# Patient Record
Sex: Male | Born: 1980 | Race: Black or African American | Hispanic: No | Marital: Single | Smoking: Never smoker
Health system: Southern US, Community
[De-identification: ages and names within clinical notes are randomized; demographics above are authoritative.]

## PROBLEM LIST (undated history)

## (undated) DIAGNOSIS — M549 Dorsalgia, unspecified: Secondary | ICD-10-CM

---

## 2000-04-06 ENCOUNTER — Emergency Department (HOSPITAL_COMMUNITY): Admission: EM | Admit: 2000-04-06 | Discharge: 2000-04-06 | Payer: Self-pay | Admitting: Emergency Medicine

## 2000-04-09 ENCOUNTER — Emergency Department (HOSPITAL_COMMUNITY): Admission: EM | Admit: 2000-04-09 | Discharge: 2000-04-09 | Payer: Self-pay | Admitting: *Deleted

## 2008-02-07 ENCOUNTER — Emergency Department (HOSPITAL_COMMUNITY): Admission: EM | Admit: 2008-02-07 | Discharge: 2008-02-07 | Payer: Self-pay | Admitting: Emergency Medicine

## 2008-07-30 ENCOUNTER — Emergency Department (HOSPITAL_COMMUNITY): Admission: EM | Admit: 2008-07-30 | Discharge: 2008-07-30 | Payer: Self-pay | Admitting: Emergency Medicine

## 2009-06-10 ENCOUNTER — Emergency Department (HOSPITAL_COMMUNITY): Admission: EM | Admit: 2009-06-10 | Discharge: 2009-06-10 | Payer: Self-pay | Admitting: Emergency Medicine
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
## 2010-11-11 LAB — CBC
Hemoglobin: 15 g/dL (ref 13.0–17.0)
MCHC: 34.3 g/dL (ref 30.0–36.0)
MCV: 87 fL (ref 78.0–100.0)
RBC: 5.03 MIL/uL (ref 4.22–5.81)

## 2010-11-11 LAB — COMPREHENSIVE METABOLIC PANEL
ALT: 14 U/L (ref 0–53)
Alkaline Phosphatase: 51 U/L (ref 39–117)
CO2: 25 mEq/L (ref 19–32)
Calcium: 9 mg/dL (ref 8.4–10.5)
Chloride: 104 mEq/L (ref 96–112)
GFR calc non Af Amer: >60 mL/min (ref >60)
Glucose, Bld: 95 mg/dL (ref 70–99)
Sodium: 138 mEq/L (ref 135–145)
Total Bilirubin: 0.6 mg/dL (ref 0.3–1.2)

## 2010-11-11 LAB — PROTIME-INR: Prothrombin Time: 13.1 seconds (ref 11.6–15.2)

## 2010-11-11 LAB — POCT I-STAT, CHEM 8
BUN: 12 mg/dL (ref 6–23)
Calcium, Ion: 1.14 mmol/L (ref 1.12–1.32)
Creatinine, Ser: 1 mg/dL (ref 0.4–1.5)
Glucose, Bld: 91 mg/dL (ref 70–99)
TCO2: 25 mmol/L (ref 0–100)

## 2010-11-11 LAB — LACTIC ACID, PLASMA: Lactic Acid, Venous: 1.4 mmol/L (ref 0.5–2.2)

## 2011-10-04 ENCOUNTER — Encounter (HOSPITAL_COMMUNITY): Payer: Self-pay | Admitting: Emergency Medicine

## 2011-10-04 ENCOUNTER — Emergency Department (HOSPITAL_COMMUNITY)
Admission: EM | Admit: 2011-10-04 | Discharge: 2011-10-05 | Disposition: A | Discharge status: Home / Self Care | Payer: Self-pay | Attending: Emergency Medicine | Admitting: Emergency Medicine

## 2011-10-04 DIAGNOSIS — IMO0001 Reserved for inherently not codable concepts without codable children: Secondary | ICD-10-CM | POA: Insufficient documentation

## 2011-10-04 DIAGNOSIS — L03317 Cellulitis of buttock: Secondary | ICD-10-CM | POA: Insufficient documentation

## 2011-10-04 DIAGNOSIS — L0231 Cutaneous abscess of buttock: Secondary | ICD-10-CM | POA: Insufficient documentation

## 2011-10-04 DIAGNOSIS — L0291 Cutaneous abscess, unspecified: Secondary | ICD-10-CM
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
## 2011-10-05 MED ORDER — SULFAMETHOXAZOLE-TRIMETHOPRIM 800-160 MG PO TABS: 1.0000 | ORAL_TABLET | Freq: Two times a day (BID) | ORAL | Status: AC

## 2011-10-05 NOTE — ED Notes (Signed)
Patient c/o small lesion to left buttocks. Lesion is small, reddened, localized to upper part of buttock. Patient states he thinks "it might be MRSA"

## 2011-10-05 NOTE — ED Provider Notes (Signed)
History     CSN: [id number]  Arrival date & time 10/04/11  2133   First MD Initiated Contact with Patient 10/05/11 0149      Chief Complaint  Patient presents with  . Wound Check    (Consider location/radiation/quality/duration/timing/severity/associated sxs/prior treatment) HPI Comments: Patient complains of a small area of redness on the right buttock that has been started 3 or 4 days ago. This is constant, gradually getting worse, not associated with fevers chills nausea or vomiting but does get worse when he palpates it. He admits to having frequent other small rashes in the past which spontaneously drained or weren't lanced.  Patient is a 31 y.o. male presenting with wound check. The history is provided by the patient.  Wound Check     History reviewed. No pertinent past medical history.  History reviewed. No pertinent past surgical history.  No family history on file.  History  Substance Use Topics  . Smoking status: Never Smoker   . Smokeless tobacco: Not on file  . Alcohol Use: No      Review of Systems  Constitutional: Negative for fever and chills.  Gastrointestinal: Negative for nausea and vomiting.  Skin: Positive for rash.       abscess    Allergies  Review of patient's allergies indicates no known allergies.  Home Medications   Current Outpatient Rx  Name Route Sig Dispense Refill  . OXYCODONE-ACETAMINOPHEN 5-325 MG PO TABS Oral Take 1 tablet by mouth every 4 (four) hours as needed. Pain    . SULFAMETHOXAZOLE-TRIMETHOPRIM 800-160 MG PO TABS Oral Take 1 tablet by mouth every 12 (twelve) hours. 20 tablet 0    BP 152/83  Pulse 69  Temp 98 F (36.7 C)  Resp 16  Wt 225 lb (102.059 kg)  SpO2 100%  Physical Exam  Nursing note and vitals reviewed. Constitutional: He appears well-developed and well-nourished. No distress.  HENT:  Head: Normocephalic and atraumatic.  Eyes: Conjunctivae are normal. Right eye exhibits no discharge. Left eye  exhibits no discharge. No scleral icterus.  Cardiovascular: Normal rate and regular rhythm.   No murmur heard. Pulmonary/Chest: Effort normal and breath sounds normal.  Musculoskeletal: He exhibits tenderness. He exhibits no edema.       1 cm area of redness with minimal induration but no fluctuance on the right upper buttock.  Skin: Skin is warm and dry. He is not diaphoretic.    ED Course  Procedures (including critical care time)  Labs Reviewed - No data to display No results found.   1. Abscess       MDM  Abscess as described, no indication for incision and drainage, antibiotics only, vital signs normal, patient well appearing and amenable to followup. Resource list given for followup        Vida Roller, MD 10/05/11 [phone]

## 2012-12-06 ENCOUNTER — Encounter (HOSPITAL_COMMUNITY): Payer: Self-pay | Admitting: *Deleted

## 2012-12-06 ENCOUNTER — Emergency Department (HOSPITAL_COMMUNITY): Payer: Self-pay

## 2012-12-06 ENCOUNTER — Emergency Department (HOSPITAL_COMMUNITY)
Admission: EM | Admit: 2012-12-06 | Discharge: 2012-12-06 | Disposition: A | Discharge status: Home / Self Care | Payer: Self-pay | Attending: Emergency Medicine | Admitting: Emergency Medicine

## 2012-12-06 DIAGNOSIS — M545 Low back pain, unspecified: Secondary | ICD-10-CM | POA: Insufficient documentation

## 2012-12-06 DIAGNOSIS — Z87828 Personal history of other (healed) physical injury and trauma: Secondary | ICD-10-CM | POA: Insufficient documentation

## 2012-12-06 DIAGNOSIS — M549 Dorsalgia, unspecified: Secondary | ICD-10-CM

## 2012-12-06 HISTORY — DX: Dorsalgia, unspecified: M54.9

## 2012-12-06 MED ORDER — KETOROLAC TROMETHAMINE 60 MG/2ML IM SOLN
60.0000 mg | Freq: Once | INTRAMUSCULAR | Status: AC
  Administered 2012-12-06: 60 mg via INTRAMUSCULAR
  Filled 2012-12-06: qty 2

## 2012-12-06 MED ORDER — IBUPROFEN 800 MG PO TABS: 800.0000 mg | ORAL_TABLET | Freq: Three times a day (TID) | ORAL | Status: DC

## 2012-12-06 MED ORDER — METHOCARBAMOL 500 MG PO TABS: 500.0000 mg | ORAL_TABLET | Freq: Two times a day (BID) | ORAL | Status: DC

## 2012-12-06 NOTE — ED Provider Notes (Signed)
History  This chart was scribed for non-physician practitioner working with Loren Racer, MD by Greggory Stallion, ED scribe. This patient was seen in room WTR6/WTR6 and the patient's care was started at 9:06 PM.   CSN: [id number]  Arrival date & time 12/06/12  2055    Chief Complaint  Patient presents with  . Back Pain    The history is provided by the patient. No language interpreter was used.    Gene Hawkins is a 32 y.o. male who presents to the Emergency Department with chief complaint of constant, moderate lower back pain. Pt states the pain is a 5/10 and radiates down his left leg. Pt states he had lower back injury in 2009 from doing a back exercise and states since then he has had intermittent back pain. Pt states the pain has gotten worse over the past year. Pt denies fever, chills, nausea, vomiting, diarrhea, urinary and bowel incontinence, weakness, cough, SOB and any other pain.    Past Medical History  Diagnosis Date  . Back pain     History reviewed. No pertinent past surgical history.  No family history on file.  History  Substance Use Topics  . Smoking status: Never Smoker   . Smokeless tobacco: Never Used  . Alcohol Use: No      Review of Systems A complete 10 system review of systems was obtained and all systems are negative except as noted in the HPI and PMH.    Allergies  Review of patient's allergies indicates no known allergies.  Home Medications   No current outpatient prescriptions on file.  BP 145/76  Pulse 85  Temp(Src) 98.5 F (36.9 C) (Oral)  Resp 18  SpO2 100%  Physical Exam  Nursing note and vitals reviewed. Constitutional: He is oriented to person, place, and time. He appears well-developed and well-nourished. No distress.  HENT:  Head: Normocephalic and atraumatic.  Eyes: Conjunctivae and EOM are normal. Right eye exhibits no discharge. Left eye exhibits no discharge. No scleral icterus.  Neck: Normal range of motion.  Neck supple. No tracheal deviation present.  Cardiovascular: Normal rate, regular rhythm and normal heart sounds.  Exam reveals no gallop and no friction rub.   No murmur heard. Pulmonary/Chest: Effort normal and breath sounds normal. No respiratory distress. He has no wheezes.  Abdominal: Soft. He exhibits no distension. There is no tenderness.  Musculoskeletal: Normal range of motion.  Lumbar paraspinal muscles tender to palpation, no bony step-offs or deformities  Neurological: He is alert and oriented to person, place, and time.  Sensation and strength intact bilaterally  Skin: Skin is warm and dry. He is not diaphoretic.  Psychiatric: He has a normal mood and affect. His behavior is normal. Judgment and thought content normal.    ED Course  Procedures (including critical care time)  DIAGNOSTIC STUDIES: Oxygen Saturation is 100% on RA, normal by my interpretation.    COORDINATION OF CARE: 10:12 PM-Discussed treatment plan which includes back xray and pain medication with pt at bedside and pt agreed to plan.     Labs Reviewed - No data to display Dg Lumbar Spine Complete  12/06/2012  *RADIOLOGY REPORT*  Clinical Data: Low back pain radiating to the left leg. History of injury a few years ago.  LUMBAR SPINE - COMPLETE 4+ VIEW  Comparison: None.  Findings: Five lumbar vertebrae. Mild degenerative changes in the lumbar spine with narrowed L4-5 interspace and minimal endplate osteophytes throughout.  Normal alignment of the lumbar vertebrae and  facet joints.  No vertebral compression deformities.  No focal bone lesion or bone destruction.  Bone cortex and trabecular architecture appear intact.  IMPRESSION: Mild degenerative changes in the lumbar spine.  No displaced fractures identified.   Original Report Authenticated By: Burman Nieves, M.D.      1. Back pain       MDM  Patient with back pain.  No neurological deficits and normal neuro exam.  Patient can walk but states is  painful.  No loss of bowel or bladder control.  No concern for cauda equina.  No fever, night sweats, weight loss, h/o cancer, IVDU.  RICE protocol and pain medicine indicated and discussed with patient.       I personally performed the services described in this documentation, which was scribed in my presence. The recorded information has been reviewed and is accurate.    Roxy Horseman, PA-C 12/06/12 2232

## 2012-12-06 NOTE — ED Notes (Signed)
Pt states had a lower back injury in 2009 from doing a back exercise, states since then has had back pain, the past year has been worse.

## 2012-12-07 NOTE — ED Provider Notes (Signed)
Medical screening examination/treatment/procedure(s) were performed by non-physician practitioner and as supervising physician I was immediately available for consultation/collaboration.   Jianna Drabik, MD 12/07/12 0012 

## 2014-03-10 ENCOUNTER — Encounter (HOSPITAL_COMMUNITY): Payer: Self-pay | Admitting: Emergency Medicine

## 2014-03-10 ENCOUNTER — Emergency Department (HOSPITAL_COMMUNITY): Payer: BC Managed Care – PPO

## 2014-03-10 ENCOUNTER — Emergency Department (HOSPITAL_COMMUNITY)
Admission: EM | Admit: 2014-03-10 | Discharge: 2014-03-10 | Disposition: A | Discharge status: Home / Self Care | Payer: BC Managed Care – PPO | Attending: Emergency Medicine | Admitting: Emergency Medicine

## 2014-03-10 DIAGNOSIS — Z79899 Other long term (current) drug therapy: Secondary | ICD-10-CM | POA: Insufficient documentation

## 2014-03-10 DIAGNOSIS — S82409A Unspecified fracture of shaft of unspecified fibula, initial encounter for closed fracture: Secondary | ICD-10-CM | POA: Insufficient documentation

## 2014-03-10 DIAGNOSIS — S1093XA Contusion of unspecified part of neck, initial encounter: Principal | ICD-10-CM

## 2014-03-10 DIAGNOSIS — S0993XA Unspecified injury of face, initial encounter: Secondary | ICD-10-CM | POA: Insufficient documentation

## 2014-03-10 DIAGNOSIS — S199XXA Unspecified injury of neck, initial encounter: Secondary | ICD-10-CM

## 2014-03-10 DIAGNOSIS — S0003XA Contusion of scalp, initial encounter: Secondary | ICD-10-CM | POA: Insufficient documentation

## 2014-03-10 DIAGNOSIS — Z23 Encounter for immunization: Secondary | ICD-10-CM | POA: Insufficient documentation

## 2014-03-10 DIAGNOSIS — S0083XA Contusion of other part of head, initial encounter: Secondary | ICD-10-CM | POA: Insufficient documentation

## 2014-03-10 DIAGNOSIS — S82402A Unspecified fracture of shaft of left fibula, initial encounter for closed fracture: Secondary | ICD-10-CM

## 2014-03-10 MED ORDER — TETANUS-DIPHTH-ACELL PERTUSSIS 5-2.5-18.5 LF-MCG/0.5 IM SUSP
0.5000 mL | Freq: Once | INTRAMUSCULAR | Status: AC
  Administered 2014-03-10: 0.5 mL via INTRAMUSCULAR
  Filled 2014-03-10: qty 0.5

## 2014-03-10 MED ORDER — OXYCODONE-ACETAMINOPHEN 5-325 MG PO TABS
1.0000 | ORAL_TABLET | Freq: Once | ORAL | Status: AC
  Administered 2014-03-10: 1 via ORAL
  Filled 2014-03-10: qty 1

## 2014-03-10 MED ORDER — OXYCODONE-ACETAMINOPHEN 5-325 MG PO TABS: 1.0000 | ORAL_TABLET | ORAL | Status: AC | PRN
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
## 2014-03-10 NOTE — Discharge Instructions (Signed)
Assault, General °Assault includes any behavior, whether intentional or reckless, which results in bodily injury to another person and/or damage to property. Included in this would be any behavior, intentional or reckless, that by its nature would be understood (interpreted) by a reasonable person as intent to harm another person or to damage his/her property. Threats may be oral or written. They may be communicated through regular mail, computer, fax, or phone. These threats may be direct or implied. °FORMS OF ASSAULT INCLUDE: °· Physically assaulting a person. This includes physical threats to inflict physical harm as well as: °¨ Slapping. °¨ Hitting. °¨ Poking. °¨ Kicking. °¨ Punching. °¨ Pushing. °· Arson. °· Sabotage. °· Equipment vandalism. °· Damaging or destroying property. °· Throwing or hitting objects. °· Displaying a weapon or an object that appears to be a weapon in a threatening manner. °¨ Carrying a firearm of any kind. °¨ Using a weapon to harm someone. °· Using greater physical size/strength to intimidate another. °¨ Making intimidating or threatening gestures. °¨ Bullying. °¨ Hazing. °· Intimidating, threatening, hostile, or abusive language directed toward another person. °¨ It communicates the intention to engage in violence against that person. And it leads a reasonable person to expect that violent behavior may occur. °· Stalking another person. °IF IT HAPPENS AGAIN: °· Immediately call for emergency help (911 in [place] °· If someone poses clear and immediate danger to you, seek legal authorities to have a protective or restraining order put in place. °· Less threatening assaults can at least be reported to authorities. °STEPS TO TAKE IF A SEXUAL ASSAULT HAS HAPPENED °· Go to an area of safety. This may include a shelter or staying with a friend. Stay away from the area where you have been attacked. A large percentage of sexual assaults are caused by a friend, relative or associate. °· If  medications were given by your caregiver, take them as directed for the full length of time prescribed. °· Only take over-the-counter or prescription medicines for pain, discomfort, or fever as directed by your caregiver. °· If you have come in contact with a sexual disease, find out if you are to be tested again. If your caregiver is concerned about the HIV/AIDS virus, he/she may require you to have continued testing for several months. °· For the protection of your privacy, test results can not be given over the phone. Make sure you receive the results of your test. If your test results are not back during your visit, make an appointment with your caregiver to find out the results. Do not assume everything is normal if you have not heard from your caregiver or the medical facility. It is important for you to follow up on all of your test results. °· File appropriate papers with authorities. This is important in all assaults, even if it has occurred in a family or by a friend. °SEEK MEDICAL CARE IF: °· You have new problems because of your injuries. °· You have problems that may be because of the medicine you are taking, such as: °¨ Rash. °¨ Itching. °¨ Swelling. °¨ Trouble breathing. °· You develop belly (abdominal) pain, feel sick to your stomach (nausea) or are vomiting. °· You begin to run a temperature. °· You need supportive care or referral to a rape crisis center. These are centers with trained personnel who can help you get through this ordeal. °SEEK IMMEDIATE MEDICAL CARE IF: °· You are afraid of being threatened, beaten, or abused. In [place] call 911. °· You   receive new injuries related to abuse.  You develop severe pain in any area injured in the assault or have any change in your condition that concerns you.  You faint or lose consciousness.  You develop chest pain or shortness of breath. Document Released: 07/26/2005 Document Revised: 10/18/2011 Document Reviewed: 03/13/2008 [facility] Patient  Information 2015 [place], [place]. This information is not intended to replace advice given to you by your health care provider. Make sure you discuss any questions you have with your health care provider. Fibular Fracture, Ankle, Adult, Treated With or Without Immobilization A fibular fracture at your ankle is a break (fracture) bone in the smallest of the two bones in your lower leg, located on the outside of your leg (fibula) close to the area at your ankle joint. CAUSES  Rolling your ankle.  Twisting your ankle.  Extreme flexing or extending of your foot.  Severe force on your ankle as when falling from a distance. RISK FACTORS  Jumping activities.  Participation in sports.  Osteoporosis.  Advanced age.  Previous ankle injuries. SIGNS AND SYMPTOMS  Pain.  Swelling.  Inability to put weight on injured ankle.  Bruising.  Bone deformities at site of injury. DIAGNOSIS  This fracture is diagnosed with the help of an X-ray exam. TREATMENT  If the fractured bone did not move out of place it usually will heal without problems and does casting or splinting. If immobilization is needed for comfort or the fractured bone moved out of place and will not heal properly with immobilization, a cast or splint will be used. HOME CARE INSTRUCTIONS   Apply ice to the area of injury:  Put ice in a plastic bag.  Place a towel between your skin and the bag.  Leave the ice on for 20 minutes, 2-3 times a day.  Use crutches as directed. Resume walking without crutches as directed by your health care provider.  Only take over-the-counter or prescription medicines for pain, discomfort, or fever as directed by your health care provider.  If you have a removable splint or boot, do not remove the boot unless directed by your health care provider. SEEK MEDICAL CARE IF:   You have continued pain or more swelling  The medications do not control the pain. SEEK IMMEDIATE MEDICAL CARE  IF:  You develop severe pain in the leg or foot.  Your skin or nails below the injury turn blue or grey or feel cold or numb. MAKE SURE YOU:   Understand these instructions.  Will watch your condition.  Will get help right away if you are not doing well or get worse. Document Released: 07/26/2005 Document Revised: 05/16/2013 Document Reviewed: 03/07/2013 [facility] Patient Information 2015 [place], [place]. This information is not intended to replace advice given to you by your health care provider. Make sure you discuss any questions you have with your health care provider. Cryotherapy Cryotherapy means treatment with cold. Ice or gel packs can be used to reduce both pain and swelling. Ice is the most helpful within the first 24 to 48 hours after an injury or flare-up from overusing a muscle or joint. Sprains, strains, spasms, burning pain, shooting pain, and aches can all be eased with ice. Ice can also be used when recovering from surgery. Ice is effective, has very few side effects, and is safe for most people to use. PRECAUTIONS  Ice is not a safe treatment option for people with: Raynaud phenomenon. This is a condition affecting small blood vessels in the extremities.  Exposure to cold may cause your problems to return. Cold hypersensitivity. There are many forms of cold hypersensitivity, including: Cold urticaria. Red, itchy hives appear on the skin when the tissues begin to warm after being iced. Cold erythema. This is a red, itchy rash caused by exposure to cold. Cold hemoglobinuria. Red blood cells break down when the tissues begin to warm after being iced. The hemoglobin that carry oxygen are passed into the urine because they cannot combine with blood proteins fast enough. Numbness or altered sensitivity in the area being iced. If you have any of the following conditions, do not use ice until you have discussed cryotherapy with your caregiver: Heart conditions, such as arrhythmia,  angina, or chronic heart disease. High blood pressure. Healing wounds or open skin in the area being iced. Current infections. Rheumatoid arthritis. Poor circulation. Diabetes. Ice slows the blood flow in the region it is applied. This is beneficial when trying to stop inflamed tissues from spreading irritating chemicals to surrounding tissues. However, if you expose your skin to cold temperatures for too long or without the proper protection, you can damage your skin or nerves. Watch for signs of skin damage due to cold. HOME CARE INSTRUCTIONS Follow these tips to use ice and cold packs safely. Place a dry or damp towel between the ice and skin. A damp towel will cool the skin more quickly, so you may need to shorten the time that the ice is used. For a more rapid response, add gentle compression to the ice. Ice for no more than 10 to 20 minutes at a time. The bonier the area you are icing, the less time it will take to get the benefits of ice. Check your skin after 5 minutes to make sure there are no signs of a poor response to cold or skin damage. Rest 20 minutes or more between uses. Once your skin is numb, you can end your treatment. You can test numbness by very lightly touching your skin. The touch should be so light that you do not see the skin dimple from the pressure of your fingertip. When using ice, most people will feel these normal sensations in this order: cold, burning, aching, and numbness. Do not use ice on someone who cannot communicate their responses to pain, such as small children or people with dementia. HOW TO MAKE AN ICE PACK Ice packs are the most common way to use ice therapy. Other methods include ice massage, ice baths, and cryosprays. Muscle creams that cause a cold, tingly feeling do not offer the same benefits that ice offers and should not be used as a substitute unless recommended by your caregiver. To make an ice pack, do one of the following: Place crushed ice  or a bag of frozen vegetables in a sealable plastic bag. Squeeze out the excess air. Place this bag inside another plastic bag. Slide the bag into a pillowcase or place a damp towel between your skin and the bag. Mix 3 parts water with 1 part rubbing alcohol. Freeze the mixture in a sealable plastic bag. When you remove the mixture from the freezer, it will be slushy. Squeeze out the excess air. Place this bag inside another plastic bag. Slide the bag into a pillowcase or place a damp towel between your skin and the bag. SEEK MEDICAL CARE IF: You develop white spots on your skin. This may give the skin a blotchy (mottled) appearance. Your skin turns blue or pale. Your skin becomes waxy or  hard. Your swelling gets worse. MAKE SURE YOU:  Understand these instructions. Will watch your condition. Will get help right away if you are not doing well or get worse. Document Released: 03/22/2011 Document Revised: 12/10/2013 Document Reviewed: 03/22/2011 [facility] Patient Information 2015 [place], [place]. This information is not intended to replace advice given to you by your health care provider. Make sure you discuss any questions you have with your health care provider.
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
## 2014-03-11 NOTE — ED Provider Notes (Signed)
Medical screening examination/treatment/procedure(s) were performed by non-physician practitioner and as supervising physician I was immediately available for consultation/collaboration.    Suzi RootsKevin E Ilda Laskin, MD 03/11/14 [phone]
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
